# Patient Record
Sex: Male | Born: 1970 | Race: Black or African American | Hispanic: No | Marital: Single | Smoking: Current every day smoker
Health system: Southern US, Community
[De-identification: ages and names within clinical notes are randomized; demographics above are authoritative.]

---

## 2019-12-07 ENCOUNTER — Emergency Department (HOSPITAL_COMMUNITY)
Admission: EM | Admit: 2019-12-07 | Discharge: 2019-12-07 | Disposition: A | Discharge status: Home / Self Care | Payer: Self-pay | Attending: Emergency Medicine | Admitting: Emergency Medicine

## 2019-12-07 ENCOUNTER — Other Ambulatory Visit: Payer: Self-pay

## 2019-12-07 ENCOUNTER — Encounter (HOSPITAL_COMMUNITY): Payer: Self-pay

## 2019-12-07 ENCOUNTER — Emergency Department (HOSPITAL_COMMUNITY): Payer: Self-pay

## 2019-12-07 DIAGNOSIS — M25552 Pain in left hip: Secondary | ICD-10-CM | POA: Insufficient documentation

## 2019-12-07 DIAGNOSIS — Y999 Unspecified external cause status: Secondary | ICD-10-CM | POA: Insufficient documentation

## 2019-12-07 DIAGNOSIS — Y929 Unspecified place or not applicable: Secondary | ICD-10-CM | POA: Insufficient documentation

## 2019-12-07 DIAGNOSIS — Y939 Activity, unspecified: Secondary | ICD-10-CM | POA: Insufficient documentation

## 2019-12-07 DIAGNOSIS — Z5321 Procedure and treatment not carried out due to patient leaving prior to being seen by health care provider: Secondary | ICD-10-CM | POA: Insufficient documentation

## 2019-12-07 DIAGNOSIS — M79632 Pain in left forearm: Secondary | ICD-10-CM | POA: Insufficient documentation
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
## 2020-07-14 ENCOUNTER — Emergency Department (HOSPITAL_COMMUNITY)
Admission: EM | Admit: 2020-07-14 | Discharge: 2020-07-14 | Disposition: A | Discharge status: Home / Self Care | Payer: Self-pay | Attending: Emergency Medicine | Admitting: Emergency Medicine

## 2020-07-14 ENCOUNTER — Encounter (HOSPITAL_COMMUNITY): Payer: Self-pay

## 2020-07-14 ENCOUNTER — Emergency Department (HOSPITAL_COMMUNITY): Payer: Self-pay

## 2020-07-14 ENCOUNTER — Other Ambulatory Visit: Payer: Self-pay

## 2020-07-14 DIAGNOSIS — M7542 Impingement syndrome of left shoulder: Secondary | ICD-10-CM | POA: Insufficient documentation

## 2020-07-14 DIAGNOSIS — F17200 Nicotine dependence, unspecified, uncomplicated: Secondary | ICD-10-CM | POA: Insufficient documentation

## 2020-07-14 DIAGNOSIS — R059 Cough, unspecified: Secondary | ICD-10-CM | POA: Insufficient documentation

## 2020-07-14 LAB — CBC
HCT: 42.5 % (ref 39.0–52.0)
Hemoglobin: 14.6 g/dL (ref 13.0–17.0)
MCH: 29.9 pg (ref 26.0–34.0)
MCHC: 34.4 g/dL (ref 30.0–36.0)
MCV: 87.1 fL (ref 80.0–100.0)
Platelets: 266 10*3/uL (ref 150–400)
RBC: 4.88 MIL/uL (ref 4.22–5.81)
RDW: 13.2 % (ref 11.5–15.5)
WBC: 6.9 10*3/uL (ref 4.0–10.5)
nRBC: 0 % (ref 0.0–0.2)

## 2020-07-14 LAB — BASIC METABOLIC PANEL
Anion gap: 14 (ref 5–15)
BUN: 5 mg/dL — ABNORMAL LOW (ref 6–20)
CO2: 17 mmol/L — ABNORMAL LOW (ref 22–32)
Calcium: 9 mg/dL (ref 8.9–10.3)
Chloride: 99 mmol/L (ref 98–111)
Creatinine, Ser: 0.78 mg/dL (ref 0.61–1.24)
GFR, Estimated: >60 mL/min (ref >60)
Glucose, Bld: 112 mg/dL — ABNORMAL HIGH (ref 70–99)
Potassium: 3.3 mmol/L — ABNORMAL LOW (ref 3.5–5.1)
Sodium: 130 mmol/L — ABNORMAL LOW (ref 135–145)

## 2020-07-14 LAB — TROPONIN I (HIGH SENSITIVITY): Troponin I (High Sensitivity): 5 ng/L (ref <18)

## 2020-07-14 MED ORDER — SODIUM CHLORIDE 0.9 % IV BOLUS
1000.0000 mL | Freq: Once | INTRAVENOUS | Status: AC
  Administered 2020-07-14: 1000 mL via INTRAVENOUS

## 2020-07-14 MED ORDER — OMEPRAZOLE 20 MG PO CPDR
20.0000 mg | DELAYED_RELEASE_CAPSULE | Freq: Every day | ORAL | 0 refills | Status: AC
Start: 2020-07-14 — End: 2020-07-19

## 2020-07-14 MED ORDER — KETOROLAC TROMETHAMINE 30 MG/ML IJ SOLN
30.0000 mg | Freq: Once | INTRAMUSCULAR | Status: AC
  Administered 2020-07-14: 30 mg via INTRAVENOUS
  Filled 2020-07-14: qty 1

## 2020-07-14 MED ORDER — MELOXICAM 7.5 MG PO TABS: 7.5000 mg | ORAL_TABLET | Freq: Every day | ORAL | 0 refills | Status: AC

## 2020-07-14 NOTE — ED Provider Notes (Signed)
[facility]-EMERGENCY DEPT Provider Note   CSN: [id number] Arrival date & time: 07/14/20  0413     History Chief Complaint  Patient presents with  . Chest Pain    Jason Hampton is a 50 y.o. male with no prior past medical history that presents the emergency department today for cough for 2 months, no sputum.  Also complains of left arm pain that radiates down into his distal fingers for the past month.  Patient does not actually have any chest pain, his pain starts in his shoulder and goes down into his fingers.  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  No heavy lifting.  States that the pain is worse with overhead activity and is severe at night.  States that he did take Tylenol which has not been helping.  Patient states that he smokes about a pack of cigarettes a day. No trauma to this area.  No shortness of breath, no chest pain.  No fevers, productive cough, hemoptysis.  Has not seen anyone for this.  No other complaints at this time.  Denies any nausea vomiting or abdominal pain.  Denies any back pain.  No other complaints.  HPI     History reviewed. No pertinent past medical history.  There are no problems to display for this patient.   History reviewed. No pertinent surgical history.     No family history on file.  Social History   Tobacco Use  . Smoking status: Current Every Day Smoker  . Smokeless tobacco: Never Used    Home Medications Prior to Admission medications   Medication Sig Start Date End Date Taking? Authorizing Provider  acetaminophen (TYLENOL) 325 MG tablet Take 975 mg by mouth every 6 (six) hours as needed for mild pain, fever or headache.   Yes [provider]  meloxicam (MOBIC) 7.5 MG tablet Take 1 tablet (7.5 mg total) by mouth daily for 5 days. 07/14/20 07/19/20 Yes Patel, Shalyn, PA-C  omeprazole (PRILOSEC) 20 MG capsule Take 1 capsule (20 mg total) by mouth daily for 5  days. 07/14/20 07/19/20 Yes Farrel Gordon, PA-C    Allergies    Patient has no known allergies.  Review of Systems   Review of Systems  Constitutional: Negative for chills, diaphoresis, fatigue and fever.  HENT: Negative for congestion, sore throat and trouble swallowing.   Eyes: Negative for pain and visual disturbance.  Respiratory: Positive for cough. Negative for shortness of breath and wheezing.   Cardiovascular: Negative for chest pain, palpitations and leg swelling.  Gastrointestinal: Negative for abdominal distention, abdominal pain, diarrhea, nausea and vomiting.  Genitourinary: Negative for difficulty urinating.  Musculoskeletal: Positive for arthralgias. Negative for back pain, neck pain and neck stiffness.  Skin: Negative for pallor.  Neurological: Positive for numbness. Negative for dizziness, speech difficulty, weakness and headaches.  Psychiatric/Behavioral: Negative for confusion.    Physical Exam Updated Vital Signs BP (!) 131/98   Pulse 85   Temp 98.1 F (36.7 C) (Oral)   Resp 18   SpO2 100%   Physical Exam Constitutional:      General: He is not in acute distress.    Appearance: Normal appearance. He is not ill-appearing, toxic-appearing or diaphoretic.     Comments: No acute distress  HENT:     Mouth/Throat:     Mouth: Mucous membranes are dry.     Pharynx: Oropharynx is clear.  Eyes:     General: No scleral  icterus.    Extraocular Movements: Extraocular movements intact.     Pupils: Pupils are equal, round, and reactive to light.  Cardiovascular:     Rate and Rhythm: Normal rate and regular rhythm.     Pulses: Normal pulses.     Heart sounds: Normal heart sounds.  Pulmonary:     Effort: Pulmonary effort is normal. No respiratory distress.     Breath sounds: Normal breath sounds. No stridor. No wheezing, rhonchi or rales.     Comments: Does have some occasional coughing, clear lungs Chest:     Chest wall: No tenderness.  Abdominal:     General:  Abdomen is flat. There is no distension.     Palpations: Abdomen is soft.     Tenderness: There is no abdominal tenderness. There is no guarding or rebound.  Musculoskeletal:        General: No swelling or tenderness. Normal range of motion.     Cervical back: Normal range of motion and neck supple. No rigidity.     Right lower leg: No edema.     Left lower leg: No edema.     Comments: Patient with tenderness above, tenderness extending up and down anterior entire left arm.  Patient does have some subjective numbness and tingling into her arm, normal sensation.  4 out of 5 strength to digits, however I think this is most likely from lack of effort.  Normal strength to wrist, normal opposition.  Radial pulse 2+.  Skin:    General: Skin is warm and dry.     Capillary Refill: Capillary refill takes less than 2 seconds.     Coloration: Skin is not pale.  Neurological:     General: No focal deficit present.     Mental Status: He is alert and oriented to person, place, and time.  Psychiatric:        Mood and Affect: Mood normal.        Behavior: Behavior normal.     ED Results / Procedures / Treatments   Labs (all labs ordered are listed, but only abnormal results are displayed) Labs Reviewed  BASIC METABOLIC PANEL - Abnormal; Notable for the following components:      Result Value   Sodium 130 (*)    Potassium 3.3 (*)    CO2 17 (*)    Glucose, Bld 112 (*)    BUN 5 (*)    All other components within normal limits  CBC  TROPONIN I (HIGH SENSITIVITY)    EKG EKG Interpretation  Date/Time:  Tuesday July 14 2020 04:25:50 EST Ventricular Rate:  89 PR Interval:    QRS Duration: 104 QT Interval:  399 QTC Calculation: 486 R Axis:   50 Text Interpretation: Sinus rhythm Anteroseptal infarct, age indeterminate No old tracing to compare Confirmed by Dione Booze ([id number]) on 07/14/2020 4:31:04 AM   Radiology DG Chest 2 View  Result Date: 07/14/2020 CLINICAL DATA:  50 year old male with  cough, left-side chest and back pain. Smoker. EXAM: CHEST - 2 VIEW COMPARISON:  None. FINDINGS: PA and lateral views. Lung volumes and mediastinal contours are within normal limits. Visualized tracheal air column is within normal limits. No pneumothorax. No pleural effusion or consolidation. Symmetric mild bilateral pulmonary interstitial markings, relatively sparing the lung bases. No acute osseous abnormality identified. Negative visible bowel gas pattern. IMPRESSION: Symmetric mild bilateral pulmonary interstitial markings favored to be sequelae of smoking rather than viral/atypical respiratory infection. Electronically Signed   By: Althea Grimmer.D.  On: 07/14/2020 05:46    Procedures Procedures   Medications Ordered in ED Medications  sodium chloride 0.9 % bolus 1,000 mL (0 mLs Intravenous Stopped 07/14/20 1010)  ketorolac (TORADOL) 30 MG/ML injection 30 mg (30 mg Intravenous Given 07/14/20 0737)    ED Course  I have reviewed the triage vital signs and the nursing notes.  Pertinent labs & imaging results that were available during my care of the patient were reviewed by me and considered in my medical decision making (see chart for details).    MDM Rules/Calculators/A&P                         Jason Hampton is a 50 y.o. male with no prior past medical history that presents the emergency department today for cough for 2 months, this is most likely from smoking.  Chest x-ray with findings suggestive of heavy smoking, no acute cardiopulmonary disease process.  No shortness of breath, no respiratory stress, patient appears well.  In regards to patient's left arm pain, patient does not have any chest pain.  Left arm pain is worse at night and overhead activities, most likely rotator cuff tendinitis.  Is distally neurovascularly intact.  Will give Toradol here today and have patient follow-up with PCP/Ortho in regards to this.  No concerns for ACS, pain is reproducible on exam, no chest pain.   Coughing has been present for 2 months, unlikely Covid, he will to be evaluated by PCP.  Work-up today with CBC unremarkable.  BMP remarkable for sodium of 130, potassium of 3.3, CO2 of 17.  Troponin V, do not need repeat troponin, this was ordered in triage.  Patient does likely slightly dry, will give liter of fluid and Toradol for most likely impingement syndrome.  Upon reevaluation after Toradol, patient states that he feels much better and is ready for discharge.  Most of the symptoms resolved, expect cough.   Doubt need for further emergent work up at this time. I explained the diagnosis and have given explicit precautions to return to the ER including for any other new or worsening symptoms. The patient understands and accepts the medical plan as it's been dictated and I have answered their questions. Discharge instructions concerning home care and prescriptions have been given. The patient is STABLE and is discharged to home in good condition.  I discussed this case with my attending physician who cosigned this note including patient's presenting symptoms, physical exam, and planned diagnostics and interventions. Attending physician stated agreement with plan or made changes to plan which were implemented.    The patient was counseled on the dangers of tobacco use, and was advised to quit.  Reviewed strategies to maximize success, including removing cigarettes and smoking materials from environment, stress management, substitution of other forms of reinforcement, support of family/friends and written materials. Total time was 5 min CPT code 10626.    Final Clinical Impression(s) / ED Diagnoses Final diagnoses:  Impingement syndrome of left shoulder  Cough    Rx / DC Orders ED Discharge Orders         Ordered    meloxicam (MOBIC) 7.5 MG tablet  Daily        07/14/20 0903    omeprazole (PRILOSEC) 20 MG capsule  Daily        07/14/20 0903           Farrel Gordon, PA-C 07/14/20  1017    Derwood Kaplan, MD 07/14/20 1528

## 2020-07-14 NOTE — ED Triage Notes (Signed)
Pt sts cough, and left sided chest pain radiating to left arm x 1 month.

## 2020-07-14 NOTE — Discharge Instructions (Addendum)
Your work-up today was reassuring, make sure to stay hydrated.  I want you to follow-up with a primary care doctor or the Ortho doctor in regards to your impingement syndrome, you can use the attached instructions on this.  I want you to take the following medications for this as well including Mobic which is a strong anti-inflammatory, sometimes anti-inflammatories can cause stomach ulcers I am also prescribing you a medication that will help prevent this as well.  Please follow-up with a doctor in the next couple of days, the information is provided.  If you have any new worsening concerning symptoms please come back to the emergency department. Try to stop smoking.  Please speak to pharmacist about any medications prescribed today in regards to side effects or interactions.

## 2022-05-02 IMAGING — CR DG CHEST 2V
2 series · 2 of 2 positions shown · non-contrast
Comparison: None.

CLINICAL DATA: 49-year-old male with cough, left-side chest and
back pain. Smoker.

EXAM:
CHEST - 2 VIEW

[w chest pa]
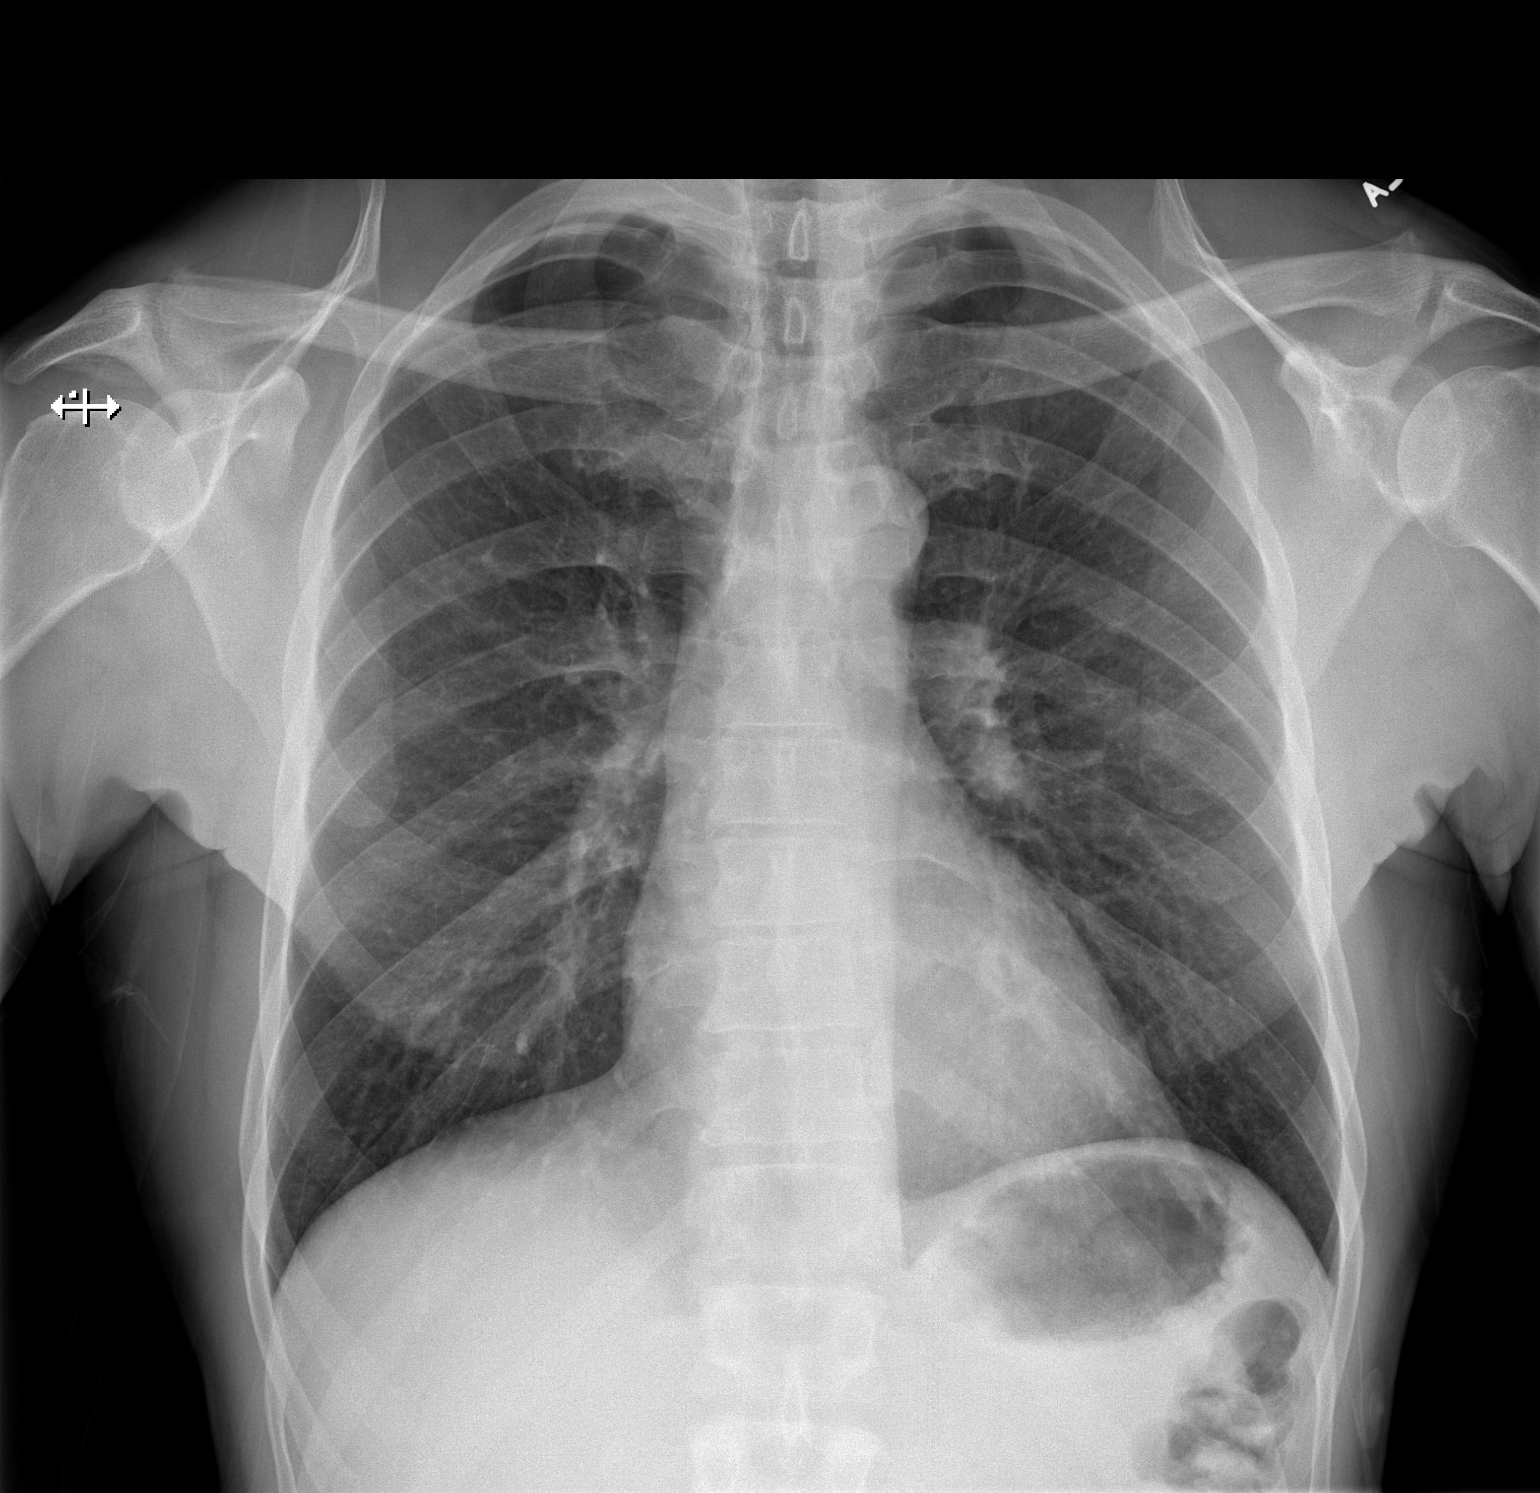

[w chest lat]
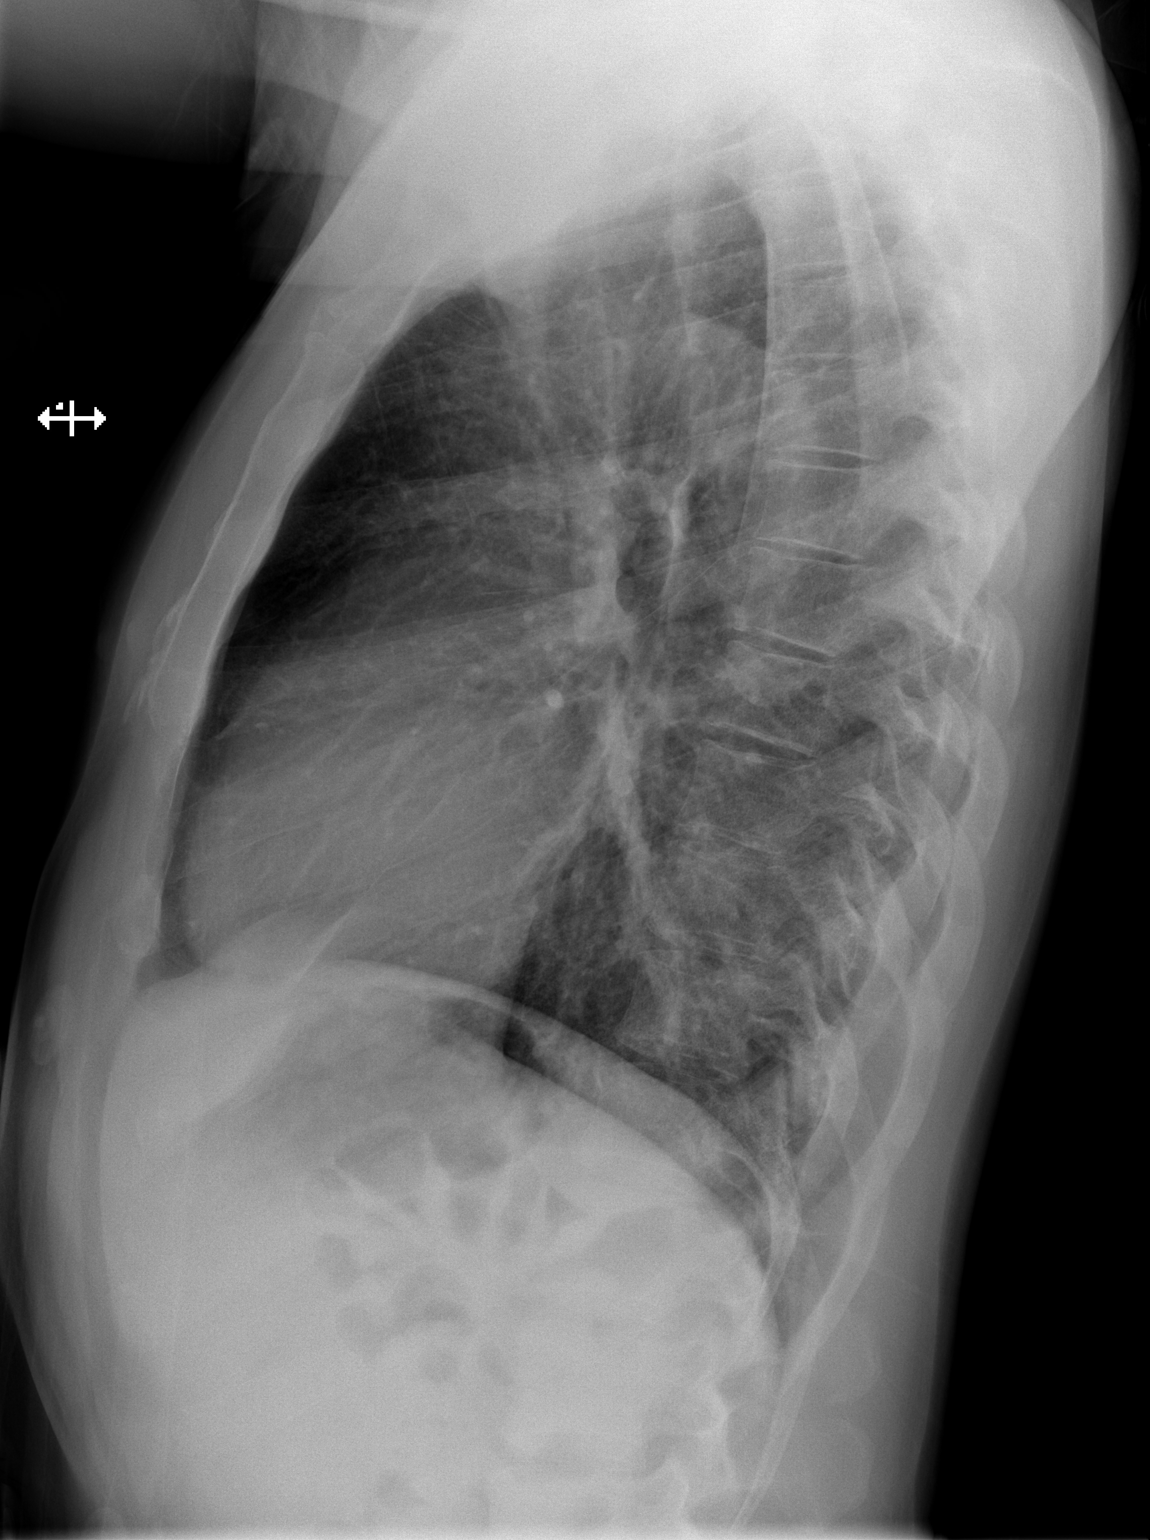

[2 of 2 positions shown; findings below may reference images not displayed]

FINDINGS: PA and lateral views. Lung volumes and mediastinal contours are
within normal limits. Visualized tracheal air column is within
normal limits. No pneumothorax. No pleural effusion or
consolidation. Symmetric mild bilateral pulmonary interstitial
markings, relatively sparing the lung bases.

No acute osseous abnormality identified. Negative visible bowel gas
pattern.
IMPRESSION: Symmetric mild bilateral pulmonary interstitial markings favored to
be sequelae of smoking rather than viral/atypical respiratory
infection.
